# Patient Record
Sex: Female | Born: 1955 | Race: Black or African American | Hispanic: No | Smoking: Current every day smoker
Health system: Southern US, Community
[De-identification: ages and names within clinical notes are randomized; demographics above are authoritative.]

## PROBLEM LIST (undated history)

## (undated) DIAGNOSIS — N289 Disorder of kidney and ureter, unspecified: Secondary | ICD-10-CM

## (undated) DIAGNOSIS — I639 Cerebral infarction, unspecified: Secondary | ICD-10-CM

## (undated) DIAGNOSIS — I219 Acute myocardial infarction, unspecified: Secondary | ICD-10-CM

## (undated) DIAGNOSIS — I10 Essential (primary) hypertension: Secondary | ICD-10-CM

## (undated) DIAGNOSIS — J449 Chronic obstructive pulmonary disease, unspecified: Secondary | ICD-10-CM

---

## 2013-12-19 ENCOUNTER — Emergency Department: Payer: Self-pay | Admitting: Emergency Medicine

## 2013-12-19 LAB — BASIC METABOLIC PANEL
Anion Gap: 7 (ref 7–16)
BUN: 15 mg/dL (ref 7–18)
CALCIUM: 9.2 mg/dL (ref 8.5–10.1)
CREATININE: 1.2 mg/dL (ref 0.60–1.30)
Chloride: 107 mmol/L (ref 98–107)
Co2: 26 mmol/L (ref 21–32)
EGFR (Non-African Amer.): 51 — ABNORMAL LOW
GFR CALC AF AMER: 59 — AB
GLUCOSE: 97 mg/dL (ref 65–99)
OSMOLALITY: 280 (ref 275–301)
Potassium: 4 mmol/L (ref 3.5–5.1)
Sodium: 140 mmol/L (ref 136–145)

## 2013-12-19 LAB — CBC
HCT: 44.2 % (ref 35.0–47.0)
HGB: 14.5 g/dL (ref 12.0–16.0)
MCH: 30 pg (ref 26.0–34.0)
MCHC: 32.8 g/dL (ref 32.0–36.0)
MCV: 92 fL (ref 80–100)
Platelet: 271 10*3/uL (ref 150–440)
RBC: 4.82 10*6/uL (ref 3.80–5.20)
RDW: 15 % — ABNORMAL HIGH (ref 11.5–14.5)
WBC: 9.8 10*3/uL (ref 3.6–11.0)

## 2013-12-19 LAB — TROPONIN I: Troponin-I: <0.02 ng/mL

## 2013-12-20 LAB — D-DIMER(ARMC): D-DIMER: 410 ng/mL

## 2013-12-20 LAB — PRO B NATRIURETIC PEPTIDE: B-TYPE NATIURETIC PEPTID: 46 pg/mL (ref 0–125)

## 2013-12-20 LAB — TROPONIN I: Troponin-I: <0.02 ng/mL

## 2017-07-03 ENCOUNTER — Encounter: Payer: Self-pay | Admitting: Emergency Medicine

## 2017-07-03 ENCOUNTER — Emergency Department
Admission: EM | Admit: 2017-07-03 | Discharge: 2017-07-03 | Disposition: A | Discharge status: Home / Self Care | Payer: Medicare (Managed Care) | Attending: Emergency Medicine | Admitting: Emergency Medicine

## 2017-07-03 ENCOUNTER — Emergency Department: Payer: Medicare (Managed Care)

## 2017-07-03 DIAGNOSIS — F17210 Nicotine dependence, cigarettes, uncomplicated: Secondary | ICD-10-CM | POA: Diagnosis not present

## 2017-07-03 DIAGNOSIS — R0602 Shortness of breath: Secondary | ICD-10-CM | POA: Diagnosis present

## 2017-07-03 DIAGNOSIS — N289 Disorder of kidney and ureter, unspecified: Secondary | ICD-10-CM | POA: Insufficient documentation

## 2017-07-03 DIAGNOSIS — R05 Cough: Secondary | ICD-10-CM | POA: Diagnosis not present

## 2017-07-03 DIAGNOSIS — I252 Old myocardial infarction: Secondary | ICD-10-CM | POA: Diagnosis not present

## 2017-07-03 DIAGNOSIS — Z885 Allergy status to narcotic agent status: Secondary | ICD-10-CM | POA: Diagnosis not present

## 2017-07-03 DIAGNOSIS — I10 Essential (primary) hypertension: Secondary | ICD-10-CM | POA: Diagnosis not present

## 2017-07-03 DIAGNOSIS — R748 Abnormal levels of other serum enzymes: Secondary | ICD-10-CM | POA: Diagnosis not present

## 2017-07-03 DIAGNOSIS — R7989 Other specified abnormal findings of blood chemistry: Secondary | ICD-10-CM

## 2017-07-03 DIAGNOSIS — Z8673 Personal history of transient ischemic attack (TIA), and cerebral infarction without residual deficits: Secondary | ICD-10-CM | POA: Insufficient documentation

## 2017-07-03 DIAGNOSIS — J441 Chronic obstructive pulmonary disease with (acute) exacerbation: Secondary | ICD-10-CM

## 2017-07-03 DIAGNOSIS — R51 Headache: Secondary | ICD-10-CM | POA: Insufficient documentation

## 2017-07-03 DIAGNOSIS — R778 Other specified abnormalities of plasma proteins: Secondary | ICD-10-CM

## 2017-07-03 HISTORY — DX: Cerebral infarction, unspecified: I63.9

## 2017-07-03 HISTORY — DX: Acute myocardial infarction, unspecified: I21.9

## 2017-07-03 HISTORY — DX: Disorder of kidney and ureter, unspecified: N28.9

## 2017-07-03 HISTORY — DX: Chronic obstructive pulmonary disease, unspecified: J44.9

## 2017-07-03 HISTORY — DX: Essential (primary) hypertension: I10

## 2017-07-03 LAB — BASIC METABOLIC PANEL
ANION GAP: 14 (ref 5–15)
BUN: 13 mg/dL (ref 6–20)
CALCIUM: 9 mg/dL (ref 8.9–10.3)
CO2: 23 mmol/L (ref 22–32)
Chloride: 99 mmol/L — ABNORMAL LOW (ref 101–111)
Creatinine, Ser: 0.98 mg/dL (ref 0.44–1.00)
GLUCOSE: 157 mg/dL — AB (ref 65–99)
POTASSIUM: 3.4 mmol/L — AB (ref 3.5–5.1)
Sodium: 136 mmol/L (ref 135–145)

## 2017-07-03 LAB — CBC
HEMATOCRIT: 43.3 % (ref 35.0–47.0)
HEMOGLOBIN: 14.6 g/dL (ref 12.0–16.0)
MCH: 29.5 pg (ref 26.0–34.0)
MCHC: 33.8 g/dL (ref 32.0–36.0)
MCV: 87.3 fL (ref 80.0–100.0)
Platelets: 228 10*3/uL (ref 150–440)
RBC: 4.95 MIL/uL (ref 3.80–5.20)
RDW: 14.6 % — ABNORMAL HIGH (ref 11.5–14.5)
WBC: 11.4 10*3/uL — ABNORMAL HIGH (ref 3.6–11.0)

## 2017-07-03 LAB — TROPONIN I: TROPONIN I: 0.04 ng/mL — AB (ref <0.03)

## 2017-07-03 MED ORDER — OXYCODONE-ACETAMINOPHEN 5-325 MG PO TABS
2.0000 | ORAL_TABLET | Freq: Once | ORAL | Status: AC
  Administered 2017-07-03: 2 via ORAL
  Filled 2017-07-03: qty 2

## 2017-07-03 MED ORDER — LEVOFLOXACIN 750 MG PO TABS
750.0000 mg | ORAL_TABLET | Freq: Once | ORAL | Status: AC
  Administered 2017-07-03: 750 mg via ORAL
  Filled 2017-07-03: qty 1

## 2017-07-03 MED ORDER — IPRATROPIUM-ALBUTEROL 0.5-2.5 (3) MG/3ML IN SOLN
3.0000 mL | Freq: Once | RESPIRATORY_TRACT | Status: AC
  Administered 2017-07-03: 3 mL via RESPIRATORY_TRACT

## 2017-07-03 MED ORDER — PREDNISONE 20 MG PO TABS
60.0000 mg | ORAL_TABLET | Freq: Once | ORAL | Status: AC
  Administered 2017-07-03: 60 mg via ORAL
  Filled 2017-07-03: qty 3

## 2017-07-03 MED ORDER — AZITHROMYCIN 250 MG PO TABS: ORAL_TABLET | ORAL | 0 refills | Status: AC

## 2017-07-03 MED ORDER — ONDANSETRON 4 MG PO TBDP
ORAL_TABLET | ORAL | Status: AC
  Administered 2017-07-03: 4 mg via ORAL
  Filled 2017-07-03: qty 1

## 2017-07-03 MED ORDER — PREDNISONE 10 MG (21) PO TBPK: ORAL_TABLET | Freq: Every day | ORAL | 0 refills | Status: AC

## 2017-07-03 MED ORDER — ALBUTEROL SULFATE (2.5 MG/3ML) 0.083% IN NEBU: 5.0000 mg | INHALATION_SOLUTION | Freq: Once | RESPIRATORY_TRACT | Status: DC

## 2017-07-03 MED ORDER — ONDANSETRON 4 MG PO TBDP
4.0000 mg | ORAL_TABLET | Freq: Once | ORAL | Status: AC
  Administered 2017-07-03: 4 mg via ORAL

## 2017-07-03 MED ORDER — IPRATROPIUM-ALBUTEROL 0.5-2.5 (3) MG/3ML IN SOLN
3.0000 mL | Freq: Once | RESPIRATORY_TRACT | Status: AC
  Administered 2017-07-03: 3 mL via RESPIRATORY_TRACT
  Filled 2017-07-03: qty 9

## 2017-07-03 NOTE — ED Notes (Signed)
Pt taken to Xray at this time.

## 2017-07-03 NOTE — ED Notes (Signed)
NAD noted at time of D/C. Pt denies questions or concerns. Pt taken to the lobby via wheelchair by her husband at this time.  

## 2017-07-03 NOTE — ED Triage Notes (Signed)
Pt in via POV with complaints of increasing shortness of breath, headache, body aches x 2 days.  Pt hypertensive upon arrival, reports taking medications as prescribed.  Other vitals WDL.

## 2017-07-03 NOTE — ED Provider Notes (Signed)
[facility] Emergency Department Provider Note       Time seen: ----------------------------------------- 11:21 AM on 07/03/2017 -----------------------------------------    I have reviewed the triage vital signs and the nursing notes.  HISTORY   Chief Complaint Shortness of Breath    HPI Nicole Hoffman is a 61 y.o. female with a history of COPD, hypertension, MI who presents to the ED for increased shortness of breath, headache and body aches for the last 2 days.  She arrives hypertensive.  She reports she been taking her over-the-counter medications as prescribed.  Pain is 9 out of 10 in the head that she describes as an ache.  Nothing makes it better or worse.  Past Medical History:  Diagnosis Date  . COPD (chronic obstructive pulmonary disease) (HCC)   . Hypertension   . MI (myocardial infarction) (HCC)   . Renal disorder   . Stroke [facility](HCC)     There are no active problems to display for this patient.   No past surgical history on file.  Allergies Toradol [ketorolac tromethamine]; Tramadol; and Vicodin [hydrocodone-acetaminophen]  Social History Social History   Tobacco Use  . Smoking status: Current Every Day Smoker    Packs/day: 0.25    Types: Cigarettes  . Smokeless tobacco: Never Used  Substance Use Topics  . Alcohol use: Yes    Comment: Occassional  . Drug use: Not on file    Review of Systems Constitutional: Negative for fever. Eyes: Negative for vision changes ENT:  Negative for congestion, sore throat Cardiovascular: Negative for chest pain. Respiratory: Positive for shortness of breath Gastrointestinal: Negative for abdominal pain, vomiting and diarrhea. Genitourinary: Negative for dysuria. Musculoskeletal: Negative for back pain.  Positive for myalgias Skin: Negative for rash. Neurological: Positive for headache  All systems negative/normal/unremarkable except as stated in the  HPI  ____________________________________________   PHYSICAL EXAM:  VITAL SIGNS: ED Triage Vitals  Enc Vitals Group     BP 07/03/17 1047 (!) 207/85     Pulse Rate 07/03/17 1047 94     Resp 07/03/17 1047 16     Temp 07/03/17 1047 98.2 F (36.8 C)     Temp Source 07/03/17 1047 Oral     SpO2 07/03/17 1047 96 %     Weight 07/03/17 1048 220 lb (99.8 kg)     Height 07/03/17 1048 5\' 4"  (1.626 m)     Head Circumference --      Peak Flow --      Pain Score 07/03/17 1047 9     Pain Loc --      Pain Edu? --      Excl. in GC? --     Constitutional: Alert and oriented.  Mild distress Eyes: Conjunctivae are normal. Normal extraocular movements. ENT   Head: Normocephalic and atraumatic.   Nose: No congestion/rhinnorhea.   Mouth/Throat: Mucous membranes are moist.   Neck: No stridor. Cardiovascular: Normal rate, regular rhythm. No murmurs, rubs, or gallops. Respiratory: Bilateral wheezing, mild tachypnea with rhonchi Gastrointestinal: Soft and nontender. Normal bowel sounds Musculoskeletal: Nontender with normal range of motion in extremities. No lower extremity tenderness nor edema. Neurologic:  Normal speech and language. No gross focal neurologic deficits are appreciated.  Skin:  Skin is warm, dry and intact. No rash noted. Psychiatric: Mood and affect are normal. Speech and behavior are normal.  ____________________________________________  EKG: Interpreted by me.  Sinus rhythm rate of 90 bpm, normal PR interval, normal QRS, normal QT.  ____________________________________________  ED COURSE:  Pertinent labs & imaging results that were available during my care of the patient were reviewed by me and considered in my medical decision making (see chart for details). Patient presents for shortness of breath and headache, we will assess with labs and imaging as indicated.   Procedures ____________________________________________   LABS (pertinent  positives/negatives)  Labs Reviewed  CBC - Abnormal; Notable for the following components:      Result Value   WBC 11.4 (*)    RDW 14.6 (*)    All other components within normal limits  BASIC METABOLIC PANEL  TROPONIN I    RADIOLOGY  Chest x-ray IMPRESSION: Increased interstitial markings suggests acute bronchitic change with subsegmental atelectasis. There is no alveolar pneumonia nor pulmonary edema.  Thoracic aortic atherosclerosis. ____________________________________________  DIFFERENTIAL DIAGNOSIS   COPD, URI, pneumonia, pneumothorax, PE  FINAL ASSESSMENT AND PLAN  COPD exacerbation, elevated troponin   Plan: Patient had presented for COPD exacerbation with shortness of breath and cough. Patient's labs were remarkable only for an elevated troponin which normalized upon taking a second set. Patient's imaging is pneumonia.  Currently she is feeling better and does not want to stay in the hospital.  I have advised her she does need cardiology follow-up.  She will be discharged with steroids and encouraged to continue using her inhalers.   Emily FilbertWilliams, Jonathan E, MD   Note: This note was generated in part or whole with voice recognition software. Voice recognition is usually quite accurate but there are transcription errors that can and very often do occur. I apologize for any typographical errors that were not detected and corrected.     Emily FilbertWilliams, Jonathan E, MD 07/03/17 [phone]

## 2018-03-07 IMAGING — CR DG CHEST 2V
2 series · 2 of 2 positions shown · non-contrast
Comparison: Portable chest x-ray December 20, 2013

CLINICAL DATA: Two days of shortness of breath, body aches, and
headache. Currently hypertensive. History of COPD, previous MI, CVA,
current smoker.

EXAM:
CHEST  2 VIEW

[chest pa]
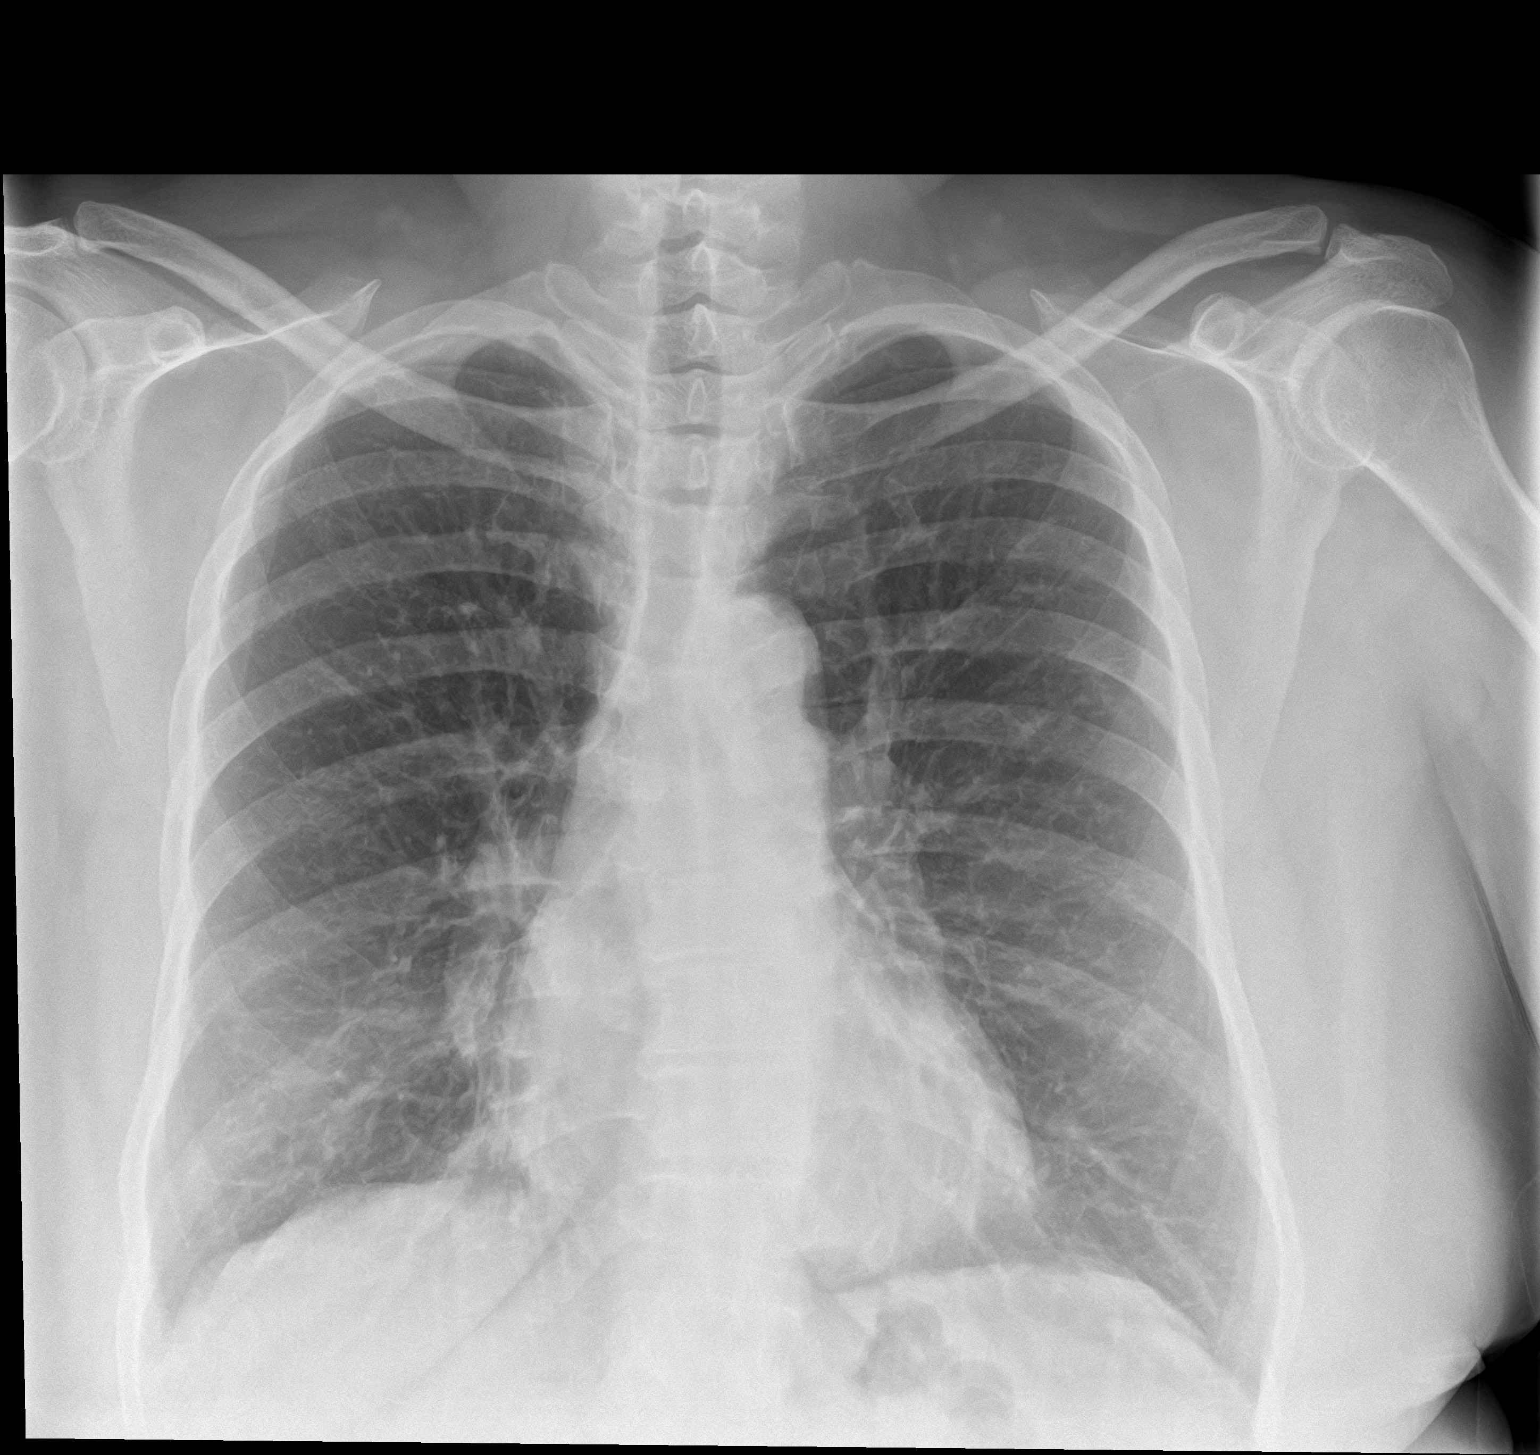

[chest lat]
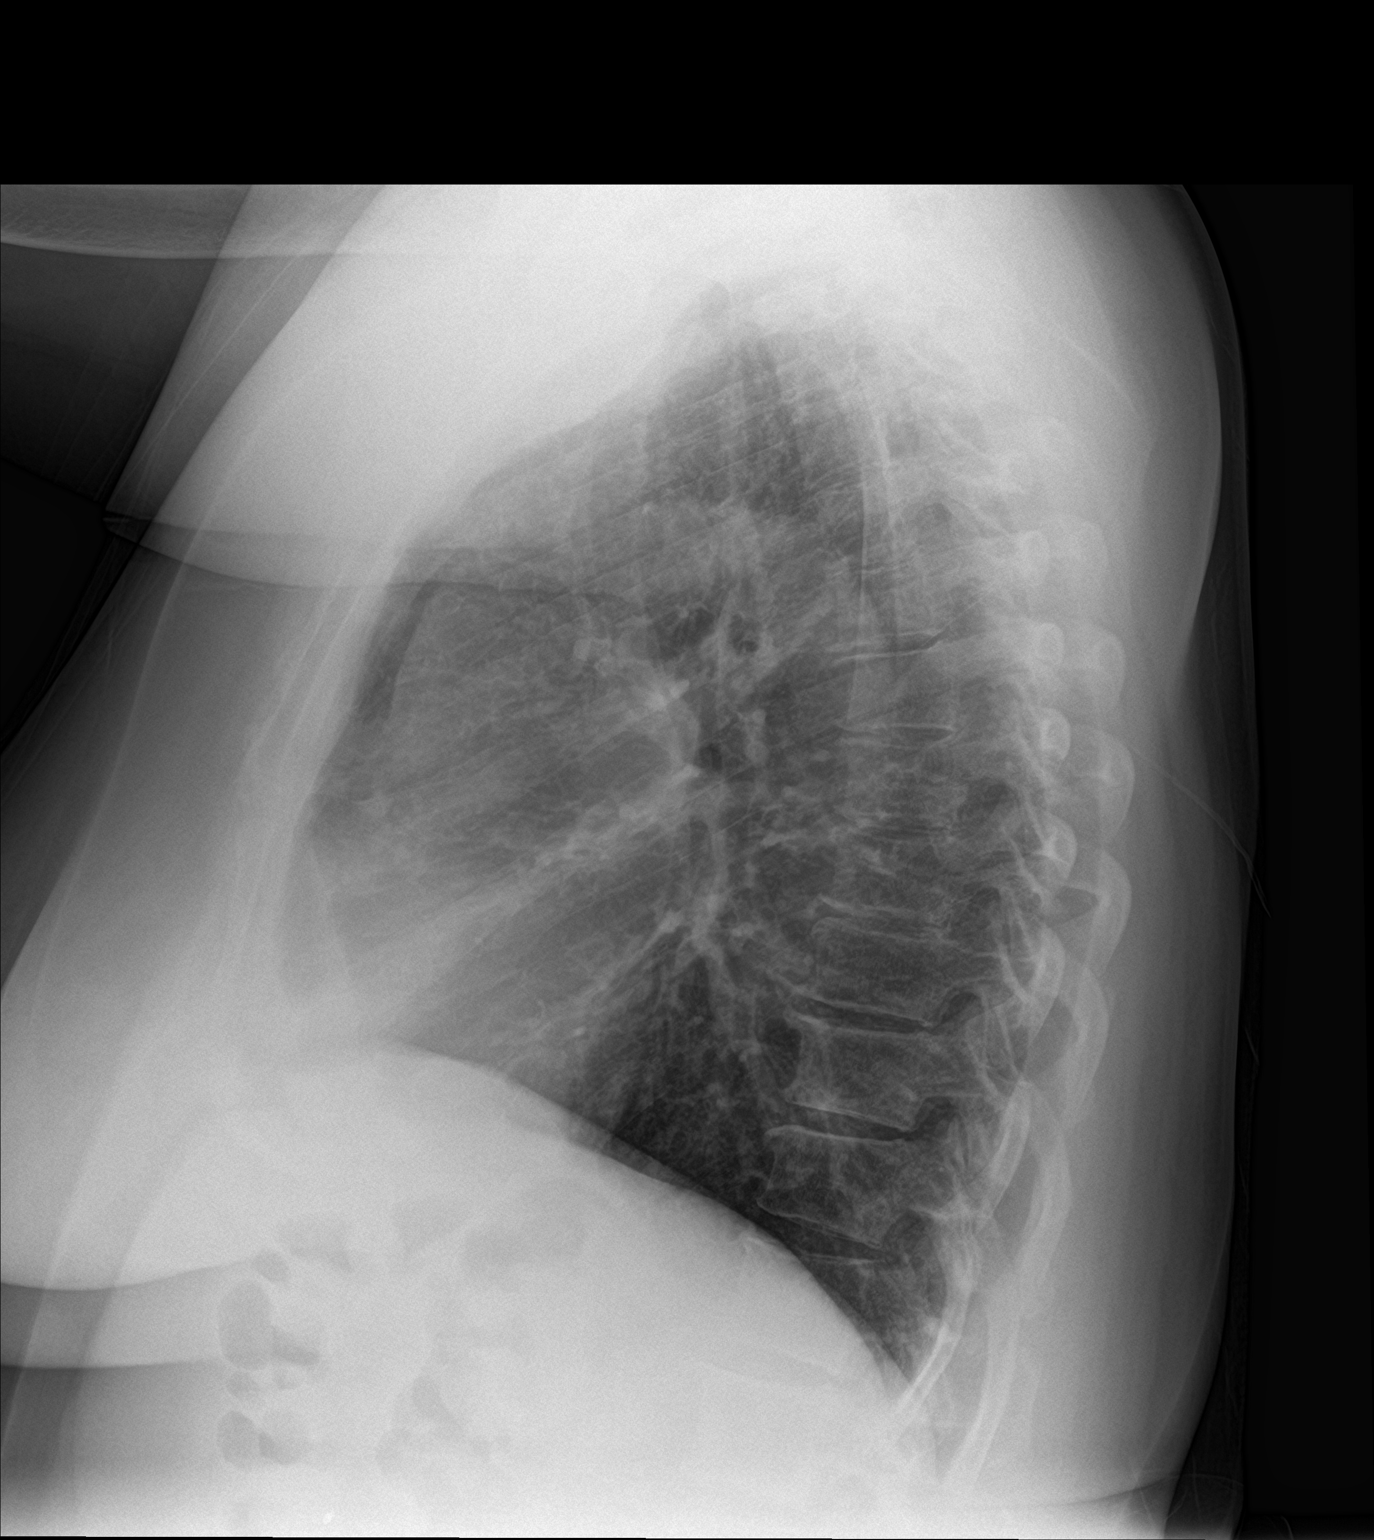

[2 of 2 positions shown; findings below may reference images not displayed]

FINDINGS: The lungs are well-expanded. The interstitial markings are coarse.
There is no alveolar infiltrate or pleural effusion. The heart and
pulmonary vascularity are normal. There is calcification in the wall
of the aortic arch. There is mild multilevel degenerative disc
disease of the thoracic spine.
IMPRESSION: Increased interstitial markings suggests acute bronchitic change
with subsegmental atelectasis. There is no alveolar pneumonia nor
pulmonary edema.

Thoracic aortic atherosclerosis.
